# Patient Record
Sex: Female | Born: 1985 | Hispanic: Yes | Marital: Single
Health system: Southern US, Community
[De-identification: ages and names within clinical notes are randomized; demographics above are authoritative.]

---

## 2012-03-06 ENCOUNTER — Inpatient Hospital Stay: Payer: Self-pay

## 2012-03-06 LAB — PIH PROFILE
Anion Gap: 11 (ref 7–16)
Chloride: 107 mmol/L (ref 98–107)
Co2: 21 mmol/L (ref 21–32)
Creatinine: 0.66 mg/dL (ref 0.60–1.30)
HCT: 29.2 % — ABNORMAL LOW (ref 35.0–47.0)
HGB: 10 g/dL — ABNORMAL LOW (ref 12.0–16.0)
MCH: 30.5 pg (ref 26.0–34.0)
MCHC: 34.4 g/dL (ref 32.0–36.0)
MCV: 89 fL (ref 80–100)
Osmolality: 279 (ref 275–301)
Platelet: 138 10*3/uL — ABNORMAL LOW (ref 150–440)
Potassium: 3.8 mmol/L (ref 3.5–5.1)
Uric Acid: 4.7 mg/dL (ref 2.6–6.0)

## 2012-03-06 LAB — PRENATAL PANEL
ABO/RH(D): A POS
Antibody Screen: NEGATIVE
Glucose: 107 mg/dL — ABNORMAL HIGH (ref 65–99)
HCT: 32.7 % — ABNORMAL LOW (ref 35.0–47.0)
HGB: 10.8 g/dL — ABNORMAL LOW (ref 12.0–16.0)
MCH: 29.2 pg (ref 26.0–34.0)
MCHC: 32.9 g/dL (ref 32.0–36.0)
MCV: 89 fL (ref 80–100)
RBC: 3.69 10*6/uL — ABNORMAL LOW (ref 3.80–5.20)

## 2012-03-06 LAB — CBC WITH DIFFERENTIAL/PLATELET
Basophil #: 0 10*3/uL (ref 0.0–0.1)
Eosinophil #: 0.1 10*3/uL (ref 0.0–0.7)
Monocyte #: 0.7 x10 3/mm (ref 0.2–0.9)
Monocyte %: 6.5 %
Neutrophil #: 7.7 10*3/uL — ABNORMAL HIGH (ref 1.4–6.5)

## 2012-03-06 LAB — RAPID HIV-1/2 QL/CONFIRM: HIV-1/2,Rapid Ql: NEGATIVE

## 2012-03-06 LAB — PROTEIN / CREATININE RATIO, URINE: Protein, Random Urine: 96 mg/dL — ABNORMAL HIGH (ref 0–12)

## 2012-03-07 LAB — CBC
HCT: 30 % — ABNORMAL LOW (ref 35.0–47.0)
MCHC: 34.4 g/dL (ref 32.0–36.0)
MCV: 88 fL (ref 80–100)
RBC: 3.4 10*6/uL — ABNORMAL LOW (ref 3.80–5.20)
RDW: 12.9 % (ref 11.5–14.5)
WBC: 10.7 10*3/uL (ref 3.6–11.0)

## 2012-03-08 LAB — PIH PROFILE
Anion Gap: 11 (ref 7–16)
BUN: 5 mg/dL — ABNORMAL LOW (ref 7–18)
Calcium, Total: 7.9 mg/dL — ABNORMAL LOW (ref 8.5–10.1)
Creatinine: 0.57 mg/dL — ABNORMAL LOW (ref 0.60–1.30)
Glucose: 71 mg/dL (ref 65–99)
HGB: 10.9 g/dL — ABNORMAL LOW (ref 12.0–16.0)
MCH: 30.2 pg (ref 26.0–34.0)
MCV: 89 fL (ref 80–100)
Osmolality: 273 (ref 275–301)
Platelet: 152 10*3/uL (ref 150–440)
RBC: 3.62 10*6/uL — ABNORMAL LOW (ref 3.80–5.20)
RDW: 13 % (ref 11.5–14.5)
SGOT(AST): 43 U/L — ABNORMAL HIGH (ref 15–37)
Uric Acid: 6 mg/dL (ref 2.6–6.0)
WBC: 15.4 10*3/uL — ABNORMAL HIGH (ref 3.6–11.0)

## 2013-07-29 ENCOUNTER — Emergency Department: Payer: Self-pay

## 2013-07-29 LAB — COMPREHENSIVE METABOLIC PANEL
Albumin: 4 g/dL (ref 3.4–5.0)
BUN: 11 mg/dL (ref 7–18)
Chloride: 105 mmol/L (ref 98–107)
Co2: 23 mmol/L (ref 21–32)
EGFR (African American): >60
EGFR (Non-African Amer.): >60
Glucose: 95 mg/dL (ref 65–99)
SGOT(AST): 12 U/L — ABNORMAL LOW (ref 15–37)
SGPT (ALT): 25 U/L (ref 12–78)
Sodium: 136 mmol/L (ref 136–145)

## 2013-07-29 LAB — CBC
HGB: 13 g/dL (ref 12.0–16.0)
MCH: 28.3 pg (ref 26.0–34.0)
MCHC: 33.4 g/dL (ref 32.0–36.0)
Platelet: 277 10*3/uL (ref 150–440)
WBC: 12.9 10*3/uL — ABNORMAL HIGH (ref 3.6–11.0)

## 2013-07-29 LAB — URINALYSIS, COMPLETE
Bacteria: NONE SEEN
Bilirubin,UR: NEGATIVE
Leukocyte Esterase: NEGATIVE
Nitrite: NEGATIVE
Protein: NEGATIVE
WBC UR: 9 /HPF (ref 0–5)

## 2014-04-27 ENCOUNTER — Emergency Department: Payer: Self-pay | Admitting: Internal Medicine

## 2014-04-27 LAB — URINALYSIS, COMPLETE
BACTERIA: NONE SEEN
Bilirubin,UR: NEGATIVE
Blood: NEGATIVE
GLUCOSE, UR: NEGATIVE mg/dL (ref 0–75)
KETONE: NEGATIVE
Leukocyte Esterase: NEGATIVE
NITRITE: NEGATIVE
PROTEIN: NEGATIVE
Ph: 5 (ref 4.5–8.0)
RBC,UR: 1 /HPF (ref 0–5)
SPECIFIC GRAVITY: 1.031 (ref 1.003–1.030)
Squamous Epithelial: 7

## 2014-04-27 LAB — COMPREHENSIVE METABOLIC PANEL
Albumin: 4.2 g/dL (ref 3.4–5.0)
Alkaline Phosphatase: 45 U/L — ABNORMAL LOW
Anion Gap: 7 (ref 7–16)
BUN: 14 mg/dL (ref 7–18)
Bilirubin,Total: 0.4 mg/dL (ref 0.2–1.0)
CO2: 24 mmol/L (ref 21–32)
Calcium, Total: 8.9 mg/dL (ref 8.5–10.1)
Chloride: 108 mmol/L — ABNORMAL HIGH (ref 98–107)
Creatinine: 0.76 mg/dL (ref 0.60–1.30)
Glucose: 95 mg/dL (ref 65–99)
OSMOLALITY: 278 (ref 275–301)
POTASSIUM: 3.6 mmol/L (ref 3.5–5.1)
SGOT(AST): 29 U/L (ref 15–37)
SGPT (ALT): 39 U/L
Sodium: 139 mmol/L (ref 136–145)
Total Protein: 7.7 g/dL (ref 6.4–8.2)

## 2014-04-27 LAB — CBC WITH DIFFERENTIAL/PLATELET
BASOS PCT: 0.2 %
Basophil #: 0 10*3/uL (ref 0.0–0.1)
EOS ABS: 0.1 10*3/uL (ref 0.0–0.7)
Eosinophil %: 0.9 %
HCT: 41.1 % (ref 35.0–47.0)
HGB: 13.4 g/dL (ref 12.0–16.0)
Lymphocyte #: 3.3 10*3/uL (ref 1.0–3.6)
Lymphocyte %: 25.7 %
MCH: 28.6 pg (ref 26.0–34.0)
MCHC: 32.6 g/dL (ref 32.0–36.0)
MCV: 88 fL (ref 80–100)
MONO ABS: 0.9 x10 3/mm (ref 0.2–0.9)
Monocyte %: 6.7 %
NEUTROS PCT: 66.5 %
Neutrophil #: 8.6 10*3/uL — ABNORMAL HIGH (ref 1.4–6.5)
Platelet: 303 10*3/uL (ref 150–440)
RBC: 4.67 10*6/uL (ref 3.80–5.20)
RDW: 13.4 % (ref 11.5–14.5)
WBC: 12.9 10*3/uL — AB (ref 3.6–11.0)

## 2014-04-27 LAB — LIPASE, BLOOD: LIPASE: 129 U/L (ref 73–393)

## 2014-12-14 NOTE — Discharge Summary (Signed)
PATIENT NAME:  Shirley Adams, Shirley Adams MR#:  [id number] DATE OF BIRTH:  11-21-1985  DATE OF ADMISSION:  03/06/2012 DATE OF DISCHARGE:  03/10/2012  PRINCIPAL PROCEDURE: Primary low transverse Cesarean section.   HOSPITAL COURSE: The patient was admitted to [facility] with evidence of preeclampsia. The patient was also with mild thrombocytopenia. Infant was noted to be in the breech presentation. The patient was taken to the operating room at 34 weeks, delivered a vigorous female with Apgars 8 and 9. Postoperatively the patient did well. Postop day #1 hemoglobin was 10.9 and platelets of 152. The patient was discharged on postop day #3. Blood pressure was 117/68, asymptomatic. She will follow-up with Dr. Feliberto GottronSchermerhorn in two weeks for wound care or before if she has headache, nausea, vomiting, fever, wound breakdown, redness.   ____________________________ Suzy Bouchardhomas J. Tremaine Earwood, MD tjs:drc D: 03/17/2012 09:17:56 ET T: 03/17/2012 14:44:00 ET JOB#: [id number]  cc: Suzy Bouchardhomas J. Liller Yohn, MD, <Dictator> Suzy BouchardHOMAS J Akeen Ledyard MD ELECTRONICALLY SIGNED 03/20/2012 10:43

## 2014-12-19 NOTE — Consult Note (Signed)
    Maternal Age 27    Gravida 2    Para 0    Term 0    PreTerm 0    Abortion 1    Living 0    EDC 17-Apr-2012    Gestational Age (wks, days) 3634    Gestation Single    Maternal Blood Type A    Maternal Rh Positive    Maternal HIV Unknown    Maternal Syphilis Ab Nonreactive    Maternal Rubella Nonimmune    Maternal HBsAg Negative    Maternal GBS Unknown    Prenatal Care Adequate    Family/Social History [place] area [place]     Additional Comments I was asked to speak to this mother by Milon Scorearon Jones to discuss the implications of delivering at [redacted] weeks gestation.  I reviewed her chart and discussed her case with the medical team.  Through a translator, I reviewed her understanding of why she was being induced and what it means to have a baby at 34 weeks.  I discussed with mom and family some of the short and long term risks of late prematurity, likely need for admission to [facility], and criteria for discharge home.  I answered parents questions, and they expressed appreciation for the consultation.  I shared my conversation with the OB team.  I spent 30 minutes with face to face time, and total of 45 minutes on consultation.  Thank you for allowing me to particiapte in her care.    Parental Contact Parents informed at length regarding prenatal care and plan   Electronic Signatures: Torrie MayersHorowitz, Celedonio Sortino N (MD)  (Signed 11-Jul-13 19:08)  Authored: PREGNANCY and LABOR, ADDITIONAL COMMENTS   Last Updated: 11-Jul-13 19:08 by Torrie MayersHorowitz, Rosebud Koenen N (MD)

## 2014-12-19 NOTE — Op Note (Signed)
PATIENT NAME:  Shirley Adams, Shirley Adams MR#:  [id number] DATE OF BIRTH:  1986/02/28  DATE OF PROCEDURE:  03/07/2012  PREOPERATIVE DIAGNOSIS:  1. Preeclampsia.  2. Thrombocytopenia.  3. [redacted] weeks gestation. 4. Breech presentation.   POSTOPERATIVE DIAGNOSIS:  1. Preeclampsia.  2. Thrombocytopenia.  3. [redacted] weeks gestation. 4. Breech presentation.   PROCEDURE:  1. Primary low transverse cesarean section.  2. On-Q pump placement.   ANESTHESIA: Surgical dosing of continuous lumbar epidural, intolerant, converted to general endotracheal anesthesia intraoperatively.   SURGEON: Suzy Bouchardhomas J. Jejuan Scala, M.D.   FIRST ASSISTANT: Acquanetta BellingAngela Lugiano, CNM    INDICATION: The patient is a 29 year old gravida 2, para 0, recently moved from [place]. She is 34 + 1 weeks and was admitted with elevated blood pressures and mild thrombocytopenia. Induction was commenced on hospital day two. On a pelvic exam, the fetus was documented to be in the footling breech position and ultrasound confirms.   DESCRIPTION OF PROCEDURE: After adequate continuous lumbar epidural surgical dosing, the patient was prepped and draped in normal sterile fashion. A Pfannenstiel incision was made two fingerbreadths above the symphysis pubis. Sharp dissection was used to identify the fascia. The fascia was opened in the midline and opened in a transverse fashion. The superior aspect of the fascia was grasped with Kocher clamps and the recti muscles dissected free. The inferior aspect of the fascia was grasped with Kocher clamps, and the pyramidalis muscle was dissected free. Once the peritoneum was grasped and opened sharply, the patient was experiencing pain. Surgeons stopped while anesthesia converted to general endotracheal anesthesia. Once that was completed, the vesicouterine peritoneal fold was identified and opened, and a bladder flap was created, and the bladder was reflected inferiorly. A low transverse versus uterine incision was made.  Upon entry into the endometrial cavity, clear fluid resulted. The incision was extended with blunt transverse traction. A footling breech was identified, and legs were delivered, followed by the body, arms, and head without difficulty. The cord was doubly clamped and an infant girl was passed to Dr. Abundio MiuHorowitz who assigned Apgar scores of 8 and 9. The placenta was manually delivered. The uterus was exteriorized and wiped clean with a laparotomy tape. A ring forceps was used to open the cervix, and the uterine incision was closed with 1 chromic suture in a running locking fashion with good approximation of edges and good hemostasis  noted. The fallopian tubes and ovaries appeared normal. The posterior cul-de-sac was suctioned and irrigated. The uterus was placed back into the abdominal cavity. The uterine incision again appeared hemostatic. Interceed was placed over the lower uterine segment incision with T-shaped fashion. The On-Q pump catheters were placed subfascially starting in the infraumbilical area. The fascia was then closed over top of this with 0 Vicryl suture in a running nonlocking fashion with good approximation of edges. Subcutaneous tissues were irrigated and bovied, and the skin was reapproximated with an Insorb stapler. The On-Q pump catheters were then secured in normal sterile fashion. The catheters were loaded each with 5 mL of 0.5% Marcaine. There were no complications. Estimated blood loss was 600 mL. Intraoperative fluids were 600 mL. The patient tolerated the procedure well and was taken to the recovery room in good condition. The patient did receive 2 grams of IV Ancef prior to commencement of the case.  ____________________________ Suzy Bouchardhomas J. Shawndra Clute, MD tjs:cbb D: 03/07/2012 17:40:47 ET T: 03/07/2012 17:52:03 ET JOB#: [id number]  cc: Suzy Bouchardhomas J. Damontre Millea, MD, <Dictator> Suzy BouchardHOMAS J Gracen Ringwald MD ELECTRONICALLY SIGNED 03/10/2012  8:45 

## 2015-01-04 NOTE — H&P (Signed)
L&D Evaluation:  History:   HPI 29 yo G2P0010 with lMp of 06-22-11 & EDD of 03/28/12 & us AT 9 WEEKS wihg EDD of 04/17/12 [facility] with "elevated BP" today. NO ROM< VB< decreased FM or UC's. No hx of elevated BP's or proteinuria.    Presents with elevated BP's    Patient's Medical History Rubella non-immune, abnormal AFP cleared by US,, GEST DM    Patient's Surgical History none    Medications Pre Natal Vitamins    Allergies NKDA    Social History none    Family History Non-Contributory   ROS:   ROS All systems were reviewed.  HEENT, CNS, GI, GU, Respiratory, CV, Renal and Musculoskeletal systems were found to be normal.   Exam:   Vital Signs BP mranges 138/87 to 128/81    Urine Protein 1+    General no apparent distress    Mental Status clear    Chest clear    Heart normal sinus rhythm, no murmur/gallop/rubs    Abdomen gravid, non-tender    Estimated Fetal Weight Average for gestational age    Back no CVAT    Edema 1+    Reflexes 1+    Clonus negative    Mebranes Intact    FHT normal rate with no decels, reactive NST    Ucx absent    Skin dry    Lymph no lymphadenopathy    Other Anemia, Thrombocytopenia   Impression:   Impression Wait for prot.creat ratio, IUP at 34 weeks   Plan:   Plan Pelvic rest, bedrest, NST q 3 days   Electronic Signatures: Sharee PimpleJones, Caryn Gienger W (CNM)  (Signed 11-Jul-13 12:59)  Authored: L&D Evaluation   Last Updated: 11-Jul-13 12:59 by Sharee PimpleJones, Jocee Kissick W (CNM)

## 2015-08-16 IMAGING — CT CT ABD-PELV W/ CM
2 of 8 series · 13 of 46 positions shown, 18 images · IV contrast (isovue)
Comparison: Ultrasound abdomen 07/29/2013.

CLINICAL DATA: Abdominal pain since midnight.

EXAM:
CT ABDOMEN AND PELVIS WITH CONTRAST
TECHNIQUE: Multidetector CT imaging of the abdomen and pelvis was performed
using the standard protocol following bolus administration of
intravenous contrast.
CONTRAST:  110 cc Isovue 370.

[Series 2: routine abd pel with · axial · 0.68mm/px · z∈[+332,+732]mm · 10 of 96 slices shown, 15 images]
[im 8/96  soft-tissue]
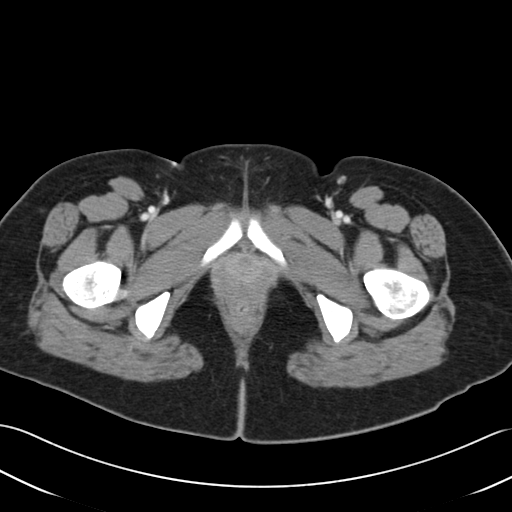
[im 8/96  bone]
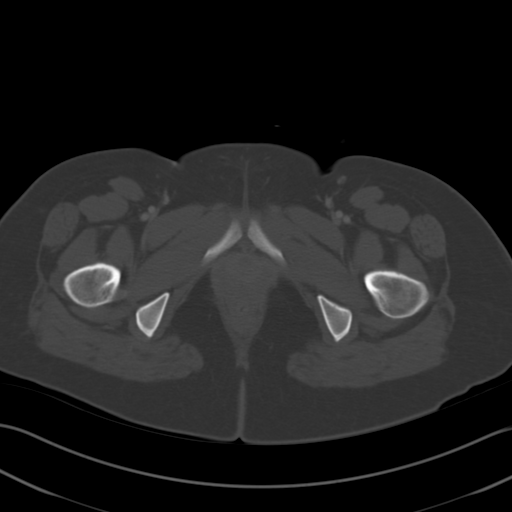
[im 16/96  soft-tissue]
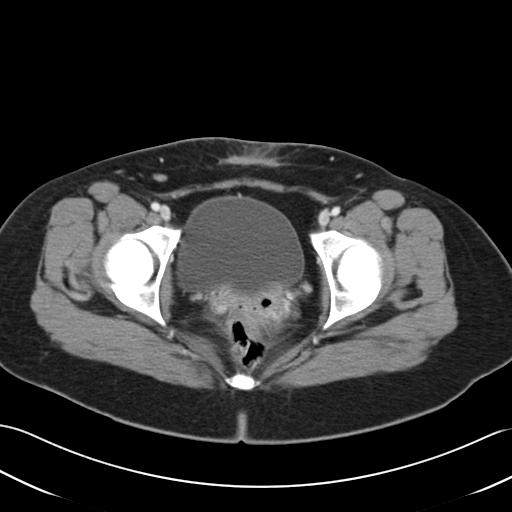
[im 32/96  soft-tissue]
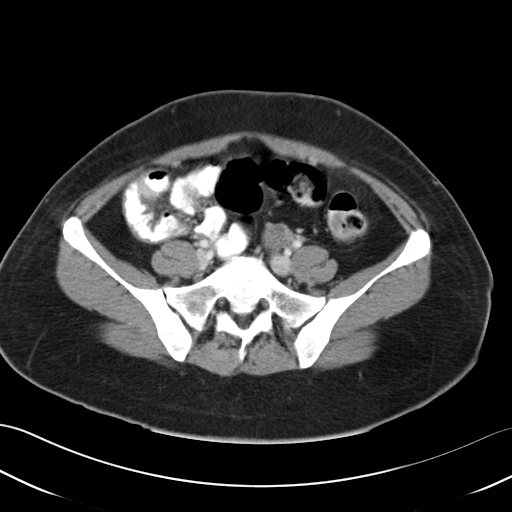
[im 40/96  soft-tissue]
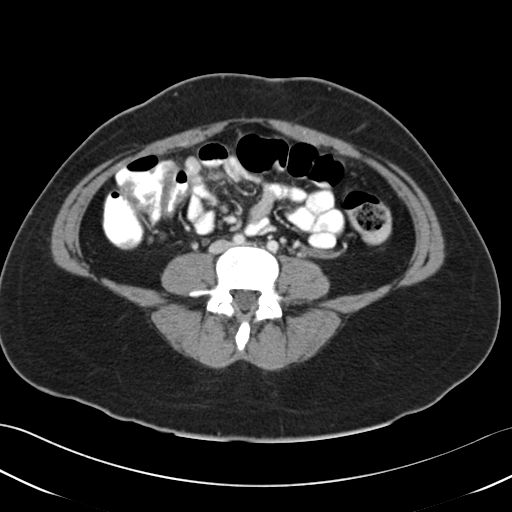
[im 48/96  soft-tissue]
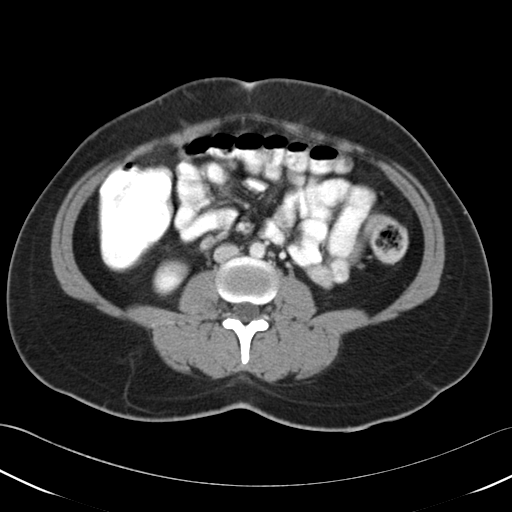
[im 56/96  soft-tissue]
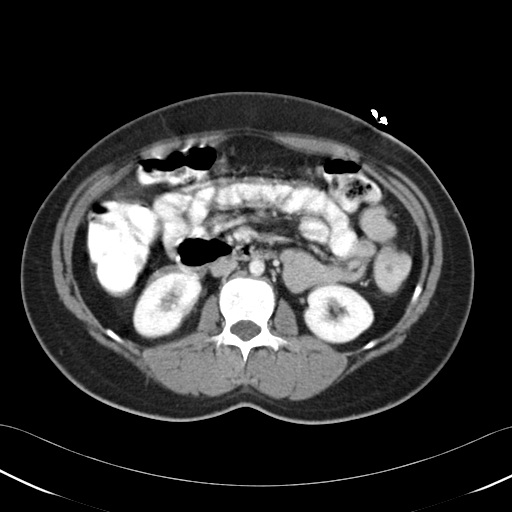
[im 64/96  soft-tissue]
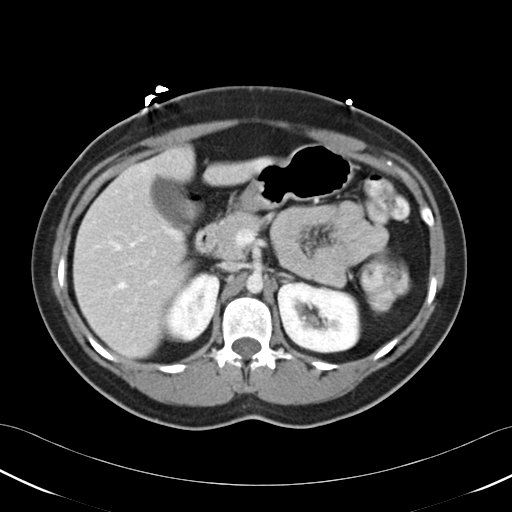
[im 64/96  lung]
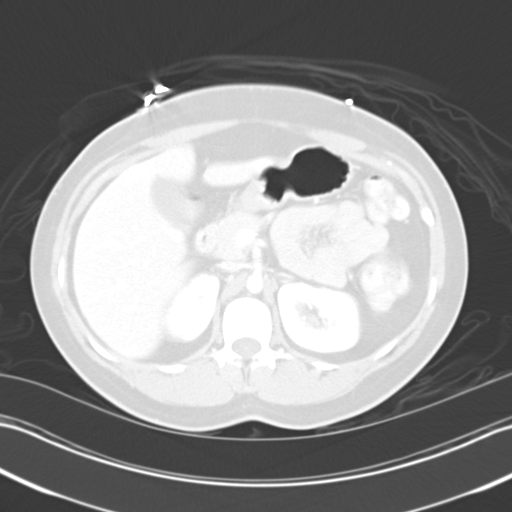
[im 72/96  lung]
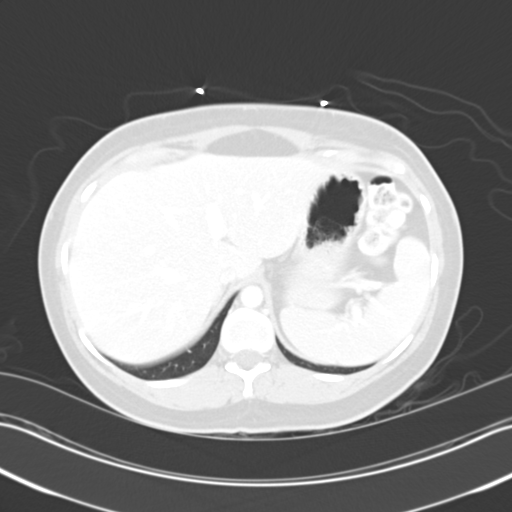
[im 80/96  soft-tissue]
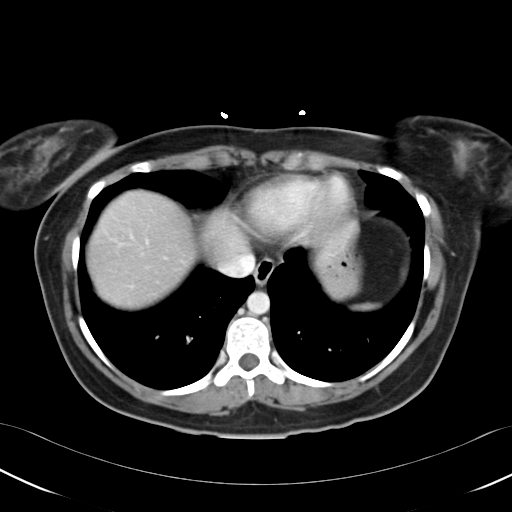
[im 80/96  lung]
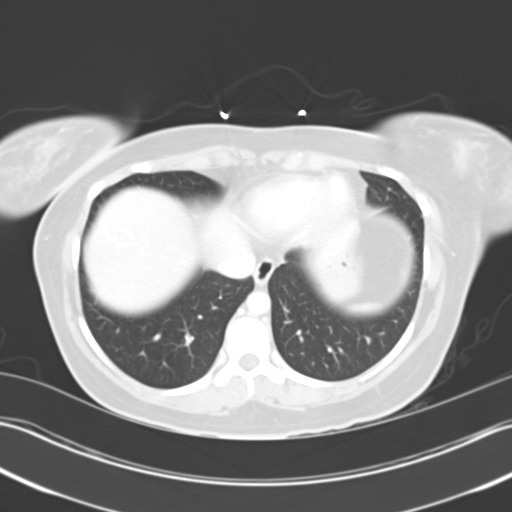
[im 88/96  soft-tissue]
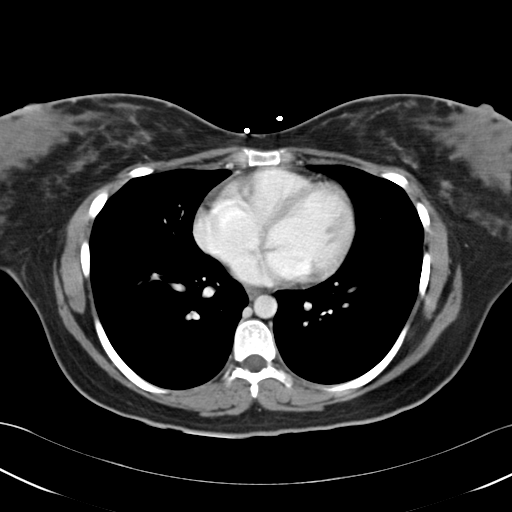
[im 88/96  lung]
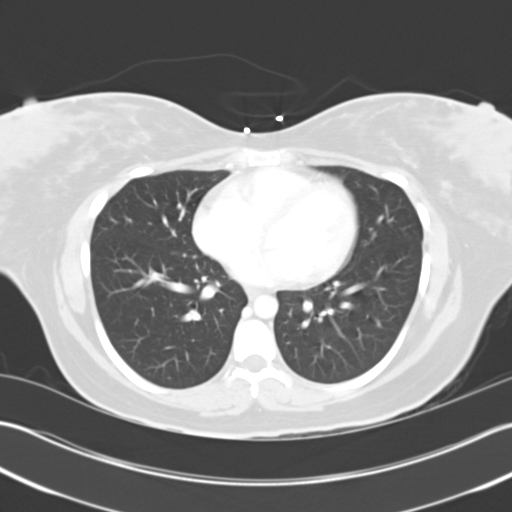
[im 88/96  bone]
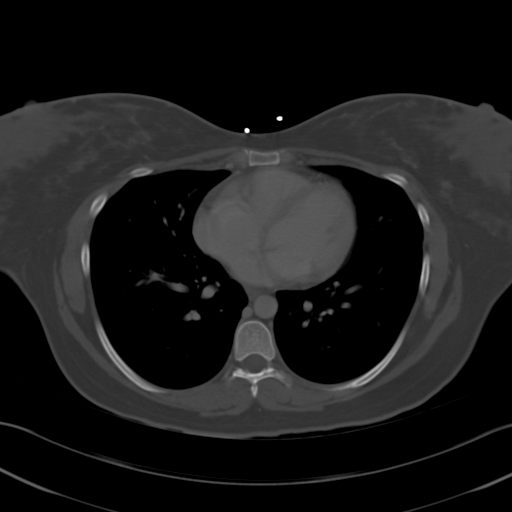

[Series 8: cor routine abd pel with · coronal · 0.64mm/px · 3 of 124 slices shown]
[im 31/124  soft-tissue]
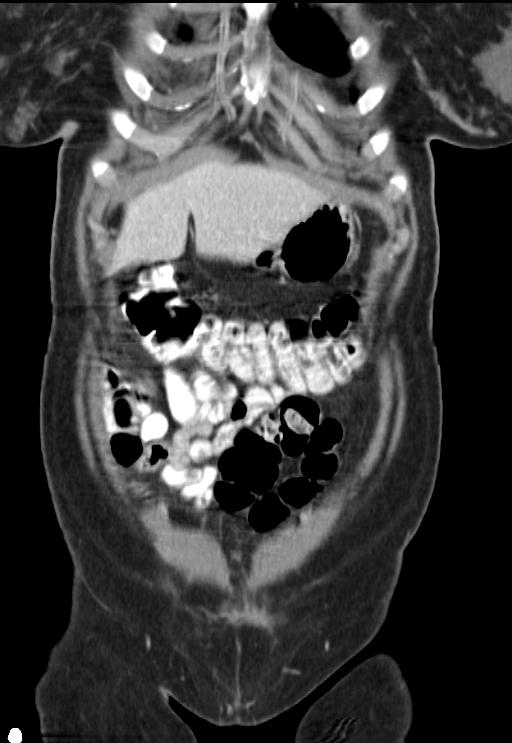
[im 62/124  soft-tissue]
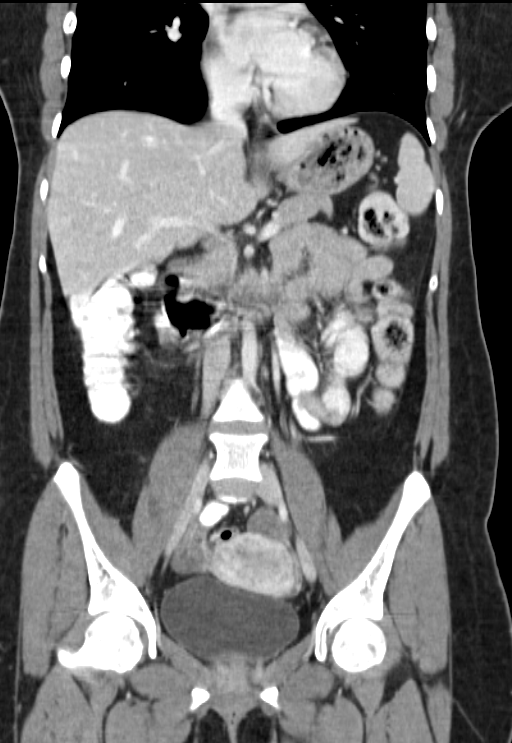
[im 93/124  soft-tissue]
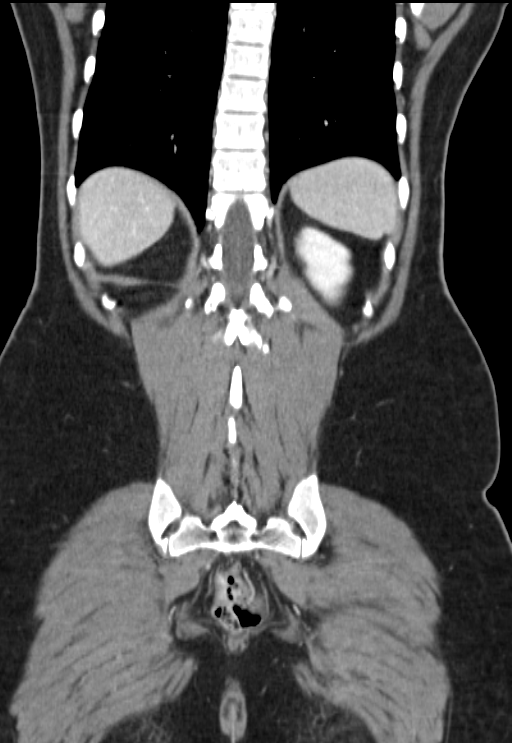

[13 of 46 positions shown; findings below may reference images not displayed]

FINDINGS: Lung bases show no acute findings. Heart size normal. No pericardial
or pleural effusion.

Liver, gallbladder, adrenal glands, kidneys, spleen, pancreas,
stomach and proximal small bowel are unremarkable. Distal small
bowel is poorly distended, limiting evaluation. Difficult to exclude
slight wall thickening involving the distal ileum. Appendix and
colon are unremarkable. Uterus and ovaries are visualized. No free
fluid. No pathologically enlarged lymph nodes. No worrisome lytic or
sclerotic lesions.
IMPRESSION: Distal ileum is under distended, making it difficult to definitively
exclude wall thickening. Otherwise, no findings to explain the
patient's abdominal pain. No evidence of appendicitis.
